# Patient Record
Sex: Male | Born: 1995 | Race: Black or African American | Hispanic: No | Marital: Single | Smoking: Current some day smoker
Health system: Southern US, Community
[De-identification: ages and names within clinical notes are randomized; demographics above are authoritative.]

## PROBLEM LIST (undated history)

## (undated) DIAGNOSIS — R519 Headache, unspecified: Secondary | ICD-10-CM

## (undated) DIAGNOSIS — R51 Headache: Secondary | ICD-10-CM

## (undated) DIAGNOSIS — F29 Unspecified psychosis not due to a substance or known physiological condition: Secondary | ICD-10-CM

## (undated) DIAGNOSIS — F909 Attention-deficit hyperactivity disorder, unspecified type: Secondary | ICD-10-CM

## (undated) DIAGNOSIS — F209 Schizophrenia, unspecified: Secondary | ICD-10-CM

## (undated) DIAGNOSIS — F4310 Post-traumatic stress disorder, unspecified: Secondary | ICD-10-CM

## (undated) HISTORY — PX: FRACTURE SURGERY: SHX138

## (undated) HISTORY — DX: Headache: R51

## (undated) HISTORY — DX: Headache, unspecified: R51.9

---
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
## 2017-12-17 ENCOUNTER — Emergency Department (HOSPITAL_COMMUNITY)
Admission: EM | Admit: 2017-12-17 | Discharge: 2017-12-17 | Disposition: A | Discharge status: Home / Self Care | Payer: Self-pay | Attending: Emergency Medicine | Admitting: Emergency Medicine

## 2017-12-17 ENCOUNTER — Emergency Department (HOSPITAL_COMMUNITY): Payer: Self-pay

## 2017-12-17 ENCOUNTER — Encounter (HOSPITAL_COMMUNITY): Payer: Self-pay

## 2017-12-17 ENCOUNTER — Other Ambulatory Visit: Payer: Self-pay

## 2017-12-17 DIAGNOSIS — G43909 Migraine, unspecified, not intractable, without status migrainosus: Secondary | ICD-10-CM | POA: Insufficient documentation

## 2017-12-17 DIAGNOSIS — Z79899 Other long term (current) drug therapy: Secondary | ICD-10-CM | POA: Insufficient documentation

## 2017-12-17 LAB — CBC
HEMATOCRIT: 42.8 % (ref 39.0–52.0)
HEMOGLOBIN: 14.4 g/dL (ref 13.0–17.0)
MCH: 28.3 pg (ref 26.0–34.0)
MCHC: 33.6 g/dL (ref 30.0–36.0)
MCV: 84.3 fL (ref 78.0–100.0)
Platelets: 302 10*3/uL (ref 150–400)
RBC: 5.08 MIL/uL (ref 4.22–5.81)
RDW: 12.4 % (ref 11.5–15.5)
WBC: 7.7 10*3/uL (ref 4.0–10.5)

## 2017-12-17 LAB — BASIC METABOLIC PANEL
Anion gap: 8 (ref 5–15)
BUN: 9 mg/dL (ref 6–20)
CHLORIDE: 108 mmol/L (ref 98–111)
CO2: 27 mmol/L (ref 22–32)
CREATININE: 1.17 mg/dL (ref 0.61–1.24)
Calcium: 10 mg/dL (ref 8.9–10.3)
GFR calc Af Amer: >60 mL/min (ref >60)
GFR calc non Af Amer: >60 mL/min (ref >60)
Glucose, Bld: 88 mg/dL (ref 70–99)
Potassium: 4 mmol/L (ref 3.5–5.1)
Sodium: 143 mmol/L (ref 135–145)

## 2017-12-17 MED ORDER — METOCLOPRAMIDE HCL 10 MG PO TABS
5.0000 mg | ORAL_TABLET | Freq: Once | ORAL | Status: AC
  Administered 2017-12-17: 5 mg via ORAL
  Filled 2017-12-17: qty 1

## 2017-12-17 MED ORDER — DIPHENHYDRAMINE HCL 25 MG PO CAPS
25.0000 mg | ORAL_CAPSULE | Freq: Once | ORAL | Status: AC
  Administered 2017-12-17: 25 mg via ORAL
  Filled 2017-12-17: qty 1

## 2017-12-17 MED ORDER — KETOROLAC TROMETHAMINE 30 MG/ML IJ SOLN
30.0000 mg | Freq: Once | INTRAMUSCULAR | Status: AC
  Administered 2017-12-17: 30 mg via INTRAMUSCULAR
  Filled 2017-12-17: qty 1

## 2017-12-17 NOTE — ED Triage Notes (Signed)
Patient from home with migraines.  Took Excedrin PTA.  A&Ox4 no visual or neuro changes.  BP 137/71

## 2017-12-17 NOTE — Discharge Instructions (Addendum)
Please read attached information. If you experience any new or worsening signs or symptoms please return to the emergency room for evaluation. Please follow-up with your primary care provider or specialist as discussed. Please use medication prescribed only as directed and discontinue taking if you have any concerning signs or symptoms.   °
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
## 2017-12-17 NOTE — ED Provider Notes (Signed)
Patient placed in Quick Look pathway, seen and evaluated   Chief Complaint: Headache  HPI:   Patient presents for evaluation of headache, which is like his typical migraines, this started today and has been constant in nature, he took Excedrin prior to arrival without relief.  He denies vision changes, no nausea or vomiting, no weakness numbness or tingling.  ROS: + Headache. -Vision changes, weakness, numbness, dizziness, nausea or vomiting  Physical Exam:   Gen: No distress  Neuro: Awake and Alert  Skin: Warm    Focused Exam: Speech is clear, able to follow commands CN III-XII intact Normal strength in upper and lower extremities bilaterally including dorsiflexion and plantar flexion, strong and equal grip strength Sensation normal to light and sharp touch Moves extremities without ataxia, coordination intact No pronator drift    Initiation of care has begun. The patient has been counseled on the process, plan, and necessity for staying for the completion/evaluation, and the remainder of the medical screening examination   Legrand RamsFord, Aftyn Nott N, PA-C 12/17/17 1646    Azalia Bilisampos, Kevin, MD 12/17/17 [phone]

## 2017-12-17 NOTE — ED Notes (Signed)
Patient transported to CT 

## 2017-12-17 NOTE — ED Notes (Signed)
ED Provider at bedside. 

## 2017-12-17 NOTE — ED Notes (Signed)
Pt verbalizes understanding of d/c instructions. Pt ambulatory at d/c with all belongings.   

## 2017-12-18 ENCOUNTER — Encounter (INDEPENDENT_AMBULATORY_CARE_PROVIDER_SITE_OTHER): Payer: Self-pay

## 2017-12-26 ENCOUNTER — Encounter

## 2017-12-27 ENCOUNTER — Encounter (HOSPITAL_COMMUNITY): Payer: Self-pay

## 2017-12-27 ENCOUNTER — Emergency Department (HOSPITAL_COMMUNITY)
Admission: EM | Admit: 2017-12-27 | Discharge: 2017-12-27 | Disposition: A | Discharge status: Home / Self Care | Payer: Self-pay | Attending: Emergency Medicine | Admitting: Emergency Medicine

## 2017-12-27 ENCOUNTER — Emergency Department (HOSPITAL_COMMUNITY): Payer: Self-pay

## 2017-12-27 ENCOUNTER — Other Ambulatory Visit: Payer: Self-pay

## 2017-12-27 DIAGNOSIS — Y999 Unspecified external cause status: Secondary | ICD-10-CM | POA: Insufficient documentation

## 2017-12-27 DIAGNOSIS — S0230XA Fracture of orbital floor, unspecified side, initial encounter for closed fracture: Secondary | ICD-10-CM | POA: Insufficient documentation

## 2017-12-27 DIAGNOSIS — Z23 Encounter for immunization: Secondary | ICD-10-CM | POA: Insufficient documentation

## 2017-12-27 DIAGNOSIS — Y929 Unspecified place or not applicable: Secondary | ICD-10-CM | POA: Insufficient documentation

## 2017-12-27 DIAGNOSIS — Y939 Activity, unspecified: Secondary | ICD-10-CM | POA: Insufficient documentation

## 2017-12-27 DIAGNOSIS — S0081XA Abrasion of other part of head, initial encounter: Secondary | ICD-10-CM | POA: Insufficient documentation

## 2017-12-27 MED ORDER — HYDROCODONE-ACETAMINOPHEN 5-325 MG PO TABS: 2.0000 | ORAL_TABLET | ORAL | 0 refills | Status: DC | PRN

## 2017-12-27 MED ORDER — FLUORESCEIN SODIUM 1 MG OP STRP
ORAL_STRIP | OPHTHALMIC | Status: AC
  Administered 2017-12-27: 1
  Filled 2017-12-27: qty 1

## 2017-12-27 MED ORDER — OXYCODONE HCL 5 MG PO TABS
5.0000 mg | ORAL_TABLET | Freq: Once | ORAL | Status: AC
  Administered 2017-12-27: 5 mg via ORAL
  Filled 2017-12-27: qty 1

## 2017-12-27 MED ORDER — ERYTHROMYCIN 5 MG/GM OP OINT
TOPICAL_OINTMENT | Freq: Once | OPHTHALMIC | Status: AC
  Administered 2017-12-27: 19:00:00 via OPHTHALMIC
  Filled 2017-12-27: qty 3.5

## 2017-12-27 MED ORDER — TETRACAINE HCL 0.5 % OP SOLN
1.0000 [drp] | Freq: Once | OPHTHALMIC | Status: AC
Start: 2017-12-27 — End: 2017-12-27
  Administered 2017-12-27: 1 [drp] via OPHTHALMIC
  Filled 2017-12-27: qty 4

## 2017-12-27 MED ORDER — ACETAMINOPHEN 500 MG PO TABS
1000.0000 mg | ORAL_TABLET | Freq: Once | ORAL | Status: AC
  Administered 2017-12-27: 1000 mg via ORAL
  Filled 2017-12-27: qty 2

## 2017-12-27 MED ORDER — NAPROXEN 500 MG PO TABS: 500.0000 mg | ORAL_TABLET | Freq: Two times a day (BID) | ORAL | 0 refills | Status: DC

## 2017-12-27 MED ORDER — TETANUS-DIPHTH-ACELL PERTUSSIS 5-2.5-18.5 LF-MCG/0.5 IM SUSP
0.5000 mL | Freq: Once | INTRAMUSCULAR | Status: AC
Start: 2017-12-27 — End: 2017-12-27
  Administered 2017-12-27: 0.5 mL via INTRAMUSCULAR
  Filled 2017-12-27: qty 0.5

## 2017-12-27 NOTE — Discharge Instructions (Addendum)
Use the eye ointment 3 times a day. Follow up with Dr. Jenne PaneBates in one week. Call the office tomorrow to schedule the follow up appointment. Return here as needed.
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
## 2017-12-31 ENCOUNTER — Encounter: Payer: Self-pay | Admitting: Neurology

## 2017-12-31 ENCOUNTER — Ambulatory Visit: Payer: Self-pay | Admitting: Neurology

## 2017-12-31 VITALS — BP 122/68 | HR 72 | Ht 70.0 in | Wt 215.0 lb

## 2017-12-31 DIAGNOSIS — H9313 Tinnitus, bilateral: Secondary | ICD-10-CM | POA: Insufficient documentation

## 2017-12-31 DIAGNOSIS — G43709 Chronic migraine without aura, not intractable, without status migrainosus: Secondary | ICD-10-CM

## 2017-12-31 DIAGNOSIS — IMO0002 Reserved for concepts with insufficient information to code with codable children: Secondary | ICD-10-CM | POA: Insufficient documentation

## 2017-12-31 MED ORDER — SUMATRIPTAN SUCCINATE 50 MG PO TABS: 50.0000 mg | ORAL_TABLET | ORAL | 6 refills | Status: AC | PRN
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
## 2018-01-05 ENCOUNTER — Emergency Department (HOSPITAL_COMMUNITY)
Admission: EM | Admit: 2018-01-05 | Discharge: 2018-01-05 | Disposition: A | Discharge status: Home / Self Care | Payer: Self-pay | Attending: Emergency Medicine | Admitting: Emergency Medicine

## 2018-01-05 ENCOUNTER — Encounter (HOSPITAL_COMMUNITY): Payer: Self-pay | Admitting: *Deleted

## 2018-01-05 ENCOUNTER — Other Ambulatory Visit: Payer: Self-pay

## 2018-01-05 DIAGNOSIS — Z5900 Homelessness unspecified: Secondary | ICD-10-CM

## 2018-01-05 DIAGNOSIS — Z79899 Other long term (current) drug therapy: Secondary | ICD-10-CM | POA: Insufficient documentation

## 2018-01-05 DIAGNOSIS — F209 Schizophrenia, unspecified: Secondary | ICD-10-CM | POA: Insufficient documentation

## 2018-01-05 DIAGNOSIS — F17200 Nicotine dependence, unspecified, uncomplicated: Secondary | ICD-10-CM | POA: Insufficient documentation

## 2018-01-05 DIAGNOSIS — H9313 Tinnitus, bilateral: Secondary | ICD-10-CM | POA: Insufficient documentation

## 2018-01-05 DIAGNOSIS — F4310 Post-traumatic stress disorder, unspecified: Secondary | ICD-10-CM | POA: Insufficient documentation

## 2018-01-05 DIAGNOSIS — F909 Attention-deficit hyperactivity disorder, unspecified type: Secondary | ICD-10-CM | POA: Insufficient documentation

## 2018-01-05 DIAGNOSIS — F1220 Cannabis dependence, uncomplicated: Secondary | ICD-10-CM

## 2018-01-05 HISTORY — DX: Schizophrenia, unspecified: F20.9

## 2018-01-05 HISTORY — DX: Unspecified psychosis not due to a substance or known physiological condition: F29

## 2018-01-05 HISTORY — DX: Post-traumatic stress disorder, unspecified: F43.10

## 2018-01-05 LAB — RAPID URINE DRUG SCREEN, HOSP PERFORMED
Amphetamines: NOT DETECTED
BARBITURATES: NOT DETECTED
Benzodiazepines: NOT DETECTED
COCAINE: NOT DETECTED
TETRAHYDROCANNABINOL: POSITIVE — AB

## 2018-01-05 LAB — CBC WITH DIFFERENTIAL/PLATELET
BASOS PCT: 0 %
Basophils Absolute: 0 10*3/uL (ref 0.0–0.1)
EOS ABS: 0.2 10*3/uL (ref 0.0–0.7)
Eosinophils Relative: 3 %
HEMATOCRIT: 40 % (ref 39.0–52.0)
HEMOGLOBIN: 14.2 g/dL (ref 13.0–17.0)
LYMPHS ABS: 2.1 10*3/uL (ref 0.7–4.0)
Lymphocytes Relative: 27 %
MCH: 28.9 pg (ref 26.0–34.0)
MCHC: 35.5 g/dL (ref 30.0–36.0)
MCV: 81.5 fL (ref 78.0–100.0)
MONO ABS: 0.5 10*3/uL (ref 0.1–1.0)
Monocytes Relative: 6 %
NEUTROS ABS: 5 10*3/uL (ref 1.7–7.7)
Neutrophils Relative %: 64 %
Platelets: 306 10*3/uL (ref 150–400)
RBC: 4.91 MIL/uL (ref 4.22–5.81)
RDW: 12.3 % (ref 11.5–15.5)
WBC: 7.9 10*3/uL (ref 4.0–10.5)

## 2018-01-05 LAB — BASIC METABOLIC PANEL
Anion gap: 11 (ref 5–15)
BUN: 13 mg/dL (ref 6–20)
CALCIUM: 9.4 mg/dL (ref 8.9–10.3)
CHLORIDE: 104 mmol/L (ref 98–111)
CO2: 25 mmol/L (ref 22–32)
CREATININE: 0.87 mg/dL (ref 0.61–1.24)
GFR calc non Af Amer: >60 mL/min (ref >60)
Glucose, Bld: 93 mg/dL (ref 70–99)
Potassium: 3.6 mmol/L (ref 3.5–5.1)
Sodium: 140 mmol/L (ref 135–145)

## 2018-01-05 LAB — ETHANOL

## 2018-01-05 LAB — ACETAMINOPHEN LEVEL

## 2018-01-05 LAB — SALICYLATE LEVEL
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
## 2018-01-05 NOTE — Progress Notes (Signed)
Per Donell SievertSpencer Simon, PA pt is recommended for continued observation for safety and stabilization and to be reassessed in the AM by psych. EDP Roxy HorsemanBrowning, Robert, PA-C and pt's nurse Kallie LocksAjsa, RN have been advised of the disposition.  Princess BruinsAquicha Noha Karasik, MSW, LCSW Therapeutic Triage Specialist  [phone]

## 2018-01-05 NOTE — ED Notes (Signed)
Pt stated "Like, literally I'm trying to find a place to stay."

## 2018-01-05 NOTE — ED Notes (Signed)
Pt to room #35.Pt uninterested. Pt denies SI/HI/AVH. Encouragement and support provided. Special checks q 15 mins in place for safety, Video monitoring in place. Will continue to monitor.

## 2018-01-05 NOTE — ED Notes (Addendum)
Pt has one white pt belongings' bag and one gray/black nike bookbag placed in the cabinet labeled "[facility] belongings [address] [facility]
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
## 2018-01-05 NOTE — Discharge Instructions (Signed)
Follow up with:   [facility] [address], [place], [place] [address]  [facility]. Call to schedule an appointment or walk in to utilize services.  [address] [place], [place] [address] [phone]

## 2018-01-05 NOTE — ED Notes (Signed)
[facility] Expected date:  Expected time:  Means of arrival:  Comments: 24
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
## 2018-01-05 NOTE — ED Triage Notes (Signed)
Per [facility], pt stated he smoked some weed then began having random pains to right hand and back.  Also c/o h/a and took an Imitrex earlier in the day, then broke one in half & took approx 45 minutes and h/a is now gone.

## 2018-01-05 NOTE — ED Notes (Addendum)
Urine culture sent down with UDS. 
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
## 2019-06-12 ENCOUNTER — Emergency Department (HOSPITAL_COMMUNITY)
Admission: EM | Admit: 2019-06-12 | Discharge: 2019-06-12 | Disposition: A | Discharge status: Home / Self Care | Payer: Self-pay | Attending: Emergency Medicine | Admitting: Emergency Medicine

## 2019-06-12 ENCOUNTER — Encounter (HOSPITAL_COMMUNITY): Payer: Self-pay | Admitting: Emergency Medicine

## 2019-06-12 DIAGNOSIS — F918 Other conduct disorders: Secondary | ICD-10-CM | POA: Insufficient documentation

## 2019-06-12 DIAGNOSIS — R456 Violent behavior: Secondary | ICD-10-CM | POA: Insufficient documentation

## 2019-06-12 DIAGNOSIS — F17200 Nicotine dependence, unspecified, uncomplicated: Secondary | ICD-10-CM | POA: Insufficient documentation

## 2019-06-12 DIAGNOSIS — R4689 Other symptoms and signs involving appearance and behavior: Secondary | ICD-10-CM

## 2019-06-12 DIAGNOSIS — Z79899 Other long term (current) drug therapy: Secondary | ICD-10-CM | POA: Insufficient documentation

## 2019-06-12 DIAGNOSIS — Z046 Encounter for general psychiatric examination, requested by authority: Secondary | ICD-10-CM | POA: Insufficient documentation

## 2019-06-12 LAB — COMPREHENSIVE METABOLIC PANEL
ALT: 15 U/L (ref 0–44)
AST: 17 U/L (ref 15–41)
Albumin: 4.6 g/dL (ref 3.5–5.0)
Alkaline Phosphatase: 70 U/L (ref 38–126)
Anion gap: 9 (ref 5–15)
BUN: 5 mg/dL — ABNORMAL LOW (ref 6–20)
CO2: 27 mmol/L (ref 22–32)
Calcium: 9.9 mg/dL (ref 8.9–10.3)
Chloride: 104 mmol/L (ref 98–111)
Creatinine, Ser: 1.14 mg/dL (ref 0.61–1.24)
GFR calc Af Amer: >60 mL/min (ref >60)
GFR calc non Af Amer: >60 mL/min (ref >60)
Glucose, Bld: 88 mg/dL (ref 70–99)
Potassium: 4.7 mmol/L (ref 3.5–5.1)
Sodium: 140 mmol/L (ref 135–145)
Total Bilirubin: 0.8 mg/dL (ref 0.3–1.2)
Total Protein: 6.9 g/dL (ref 6.5–8.1)

## 2019-06-12 LAB — ETHANOL: Alcohol, Ethyl (B): <10 mg/dL (ref <10)

## 2019-06-12 LAB — CBC
HCT: 45.9 % (ref 39.0–52.0)
Hemoglobin: 15.4 g/dL (ref 13.0–17.0)
MCH: 28.2 pg (ref 26.0–34.0)
MCHC: 33.6 g/dL (ref 30.0–36.0)
MCV: 83.9 fL (ref 80.0–100.0)
Platelets: 325 10*3/uL (ref 150–400)
RBC: 5.47 MIL/uL (ref 4.22–5.81)
RDW: 11.8 % (ref 11.5–15.5)
WBC: 7.9 10*3/uL (ref 4.0–10.5)
nRBC: 0 % (ref 0.0–0.2)

## 2019-06-12 LAB — ACETAMINOPHEN LEVEL: Acetaminophen (Tylenol), Serum: <10 ug/mL — ABNORMAL LOW (ref 10–30)

## 2019-06-12 LAB — SALICYLATE LEVEL: Salicylate Lvl: <7 mg/dL — ABNORMAL LOW (ref 7.0–30.0)

## 2019-06-12 NOTE — Discharge Instructions (Signed)
Please follow up with any out patient providers. See information below:  Outpatient Resources (Sliding scale- no insurance)  [facility]  (Assessment, Medication management and counseling) [address] [place], [place] [address] Phone: [phone] *Some walk-in assessment times available- call number above   [facility]  (Medication management and counseling) Address: [address], [place], [place] [address] Phone: [phone] *Accepts walk-in's M-F starting at 8am  [facility]  (Counseling specializing in trauma and co-occurring disorders) [email or web address] [address] [place], [place], [address] [phone] Email: [email or web address] [email or web address]  [facility]  (Wellness classes, peer support) [address],  [place], [place] [address] Phone: [phone]  [facility]  (individual and group counseling)  [address] [place], [place] [address] [phone]  Suicide Prevention information listed below:  [facility] Behavioral Health Crisis Line: [phone]  Mobile Crisis Teams [facility]      Mobile Crisis Care Unit     [phone]  If in crisis please go to your nearest emergency department to be evaluated or call 911  ____________________________________________________   Outpatient Psychiatry and/or Counseling   [facility] * (Intensive outpatient, partial hospitalization, individual therapy, and medication management) [address] [place], [place] [address] [phone] [facility] * (medication management and therapy) [address] [place], [place] [address] [phone] [facility] (Therapy only)* (takes [place] and most major insurances) [place]:  [address] [facility] [place], [place]  Phone: [phone] [place]:  [address] [place], [place]  [address]   Phone: [phone] [place]:  [address] [place], [place] [address]  Phone: [phone] [facility]  (specializes in trauma therapy, substance abuse and mood disorders. Takes patients without insurance for little to no cost out of pocket. Also takes most major insurances) [address]  [place], [place] [address] [phone] [facility] * (therapy and medication management) [address] [place], [place] [address] [phone] [facility] (Medication Management) [address] [place], [place] [address] [phone] [facility] * (therapy only) [address] [place], [place] [address] [phone] [facility] [address] [place] [address] [phone] [facility] * [address] [place], [place] [address] [phone] [facility]* (accepts non-insured)  [address] [place], [place] [address] [phone] Walk in Monday-Friday 8am-3pm [facility] [place]* (takes sliding scale and Medicaid as well as other major insurances)  [place]:   [address], [place]  Phone: [phone] [place]:   [facility] [address] [place], [place]  Phone: [phone] Walk in Monday-Friday 830am-230pm [facility]* (accepts [employer] non-insured) [address] [place] [address] [place], [place] [address] [phone] Walk in Monday-Friday 8am-5pm For a mental health emergency call: Mobile Crisis- [facility] [phone]
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
## 2019-06-12 NOTE — Progress Notes (Signed)
CSW faxed over outpatient resources and also added them to the patient's after visit summary.   Drucilla Schmidt, MSW, LCSW-A Clinical Disposition Social Worker [facility]/TTS [phone]

## 2019-06-12 NOTE — BHH Counselor (Signed)
  BH ASSESSMENT DISPOSITION  [facility] discussed case with BH Provider, Berneice Heinrich, NP who recommends discharge with community resources.  CSW faxed outpatient community resources to pt's nurse for discharge.  Jaydis Duchene L. Muhammed Teutsch, MS, [facility], [facility] Therapeutic Triage Specialist  [phone]
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
## 2019-11-04 ENCOUNTER — Emergency Department (HOSPITAL_COMMUNITY): Payer: Self-pay

## 2019-11-04 ENCOUNTER — Other Ambulatory Visit: Payer: Self-pay

## 2019-11-04 ENCOUNTER — Encounter (HOSPITAL_COMMUNITY): Payer: Self-pay

## 2019-11-04 DIAGNOSIS — Y999 Unspecified external cause status: Secondary | ICD-10-CM | POA: Insufficient documentation

## 2019-11-04 DIAGNOSIS — Y9367 Activity, basketball: Secondary | ICD-10-CM | POA: Insufficient documentation

## 2019-11-04 DIAGNOSIS — S99911A Unspecified injury of right ankle, initial encounter: Secondary | ICD-10-CM | POA: Insufficient documentation

## 2019-11-04 DIAGNOSIS — Y929 Unspecified place or not applicable: Secondary | ICD-10-CM | POA: Insufficient documentation

## 2019-11-04 DIAGNOSIS — X501XXA Overexertion from prolonged static or awkward postures, initial encounter: Secondary | ICD-10-CM | POA: Insufficient documentation

## 2019-11-04 DIAGNOSIS — F17200 Nicotine dependence, unspecified, uncomplicated: Secondary | ICD-10-CM | POA: Insufficient documentation

## 2019-11-04 NOTE — ED Triage Notes (Signed)
Pt was playing basketball and rolled his right ankle

## 2019-11-05 ENCOUNTER — Emergency Department (HOSPITAL_COMMUNITY)
Admission: EM | Admit: 2019-11-05 | Discharge: 2019-11-05 | Disposition: A | Discharge status: Home / Self Care | Payer: Self-pay | Attending: Emergency Medicine | Admitting: Emergency Medicine

## 2019-11-05 DIAGNOSIS — S99911A Unspecified injury of right ankle, initial encounter: Secondary | ICD-10-CM

## 2019-11-05 NOTE — Discharge Instructions (Signed)
Can take tylenol or motrin for pain.  Wear ankle brace for now.  Can ice/elevate if worsening swelling. Follow-up with Dr. Roda Shutters if any ongoing issues with the ankle. Return to the ED for new or worsening symptoms.
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
## 2022-01-04 IMAGING — CR DG ANKLE COMPLETE 3+V*R*
3 series · 3 of 3 positions shown · non-contrast
Comparison: None.

CLINICAL DATA: Basketball injury, fell, right ankle injury

EXAM:
RIGHT ANKLE - COMPLETE 3+ VIEW

[x ankle ap right]
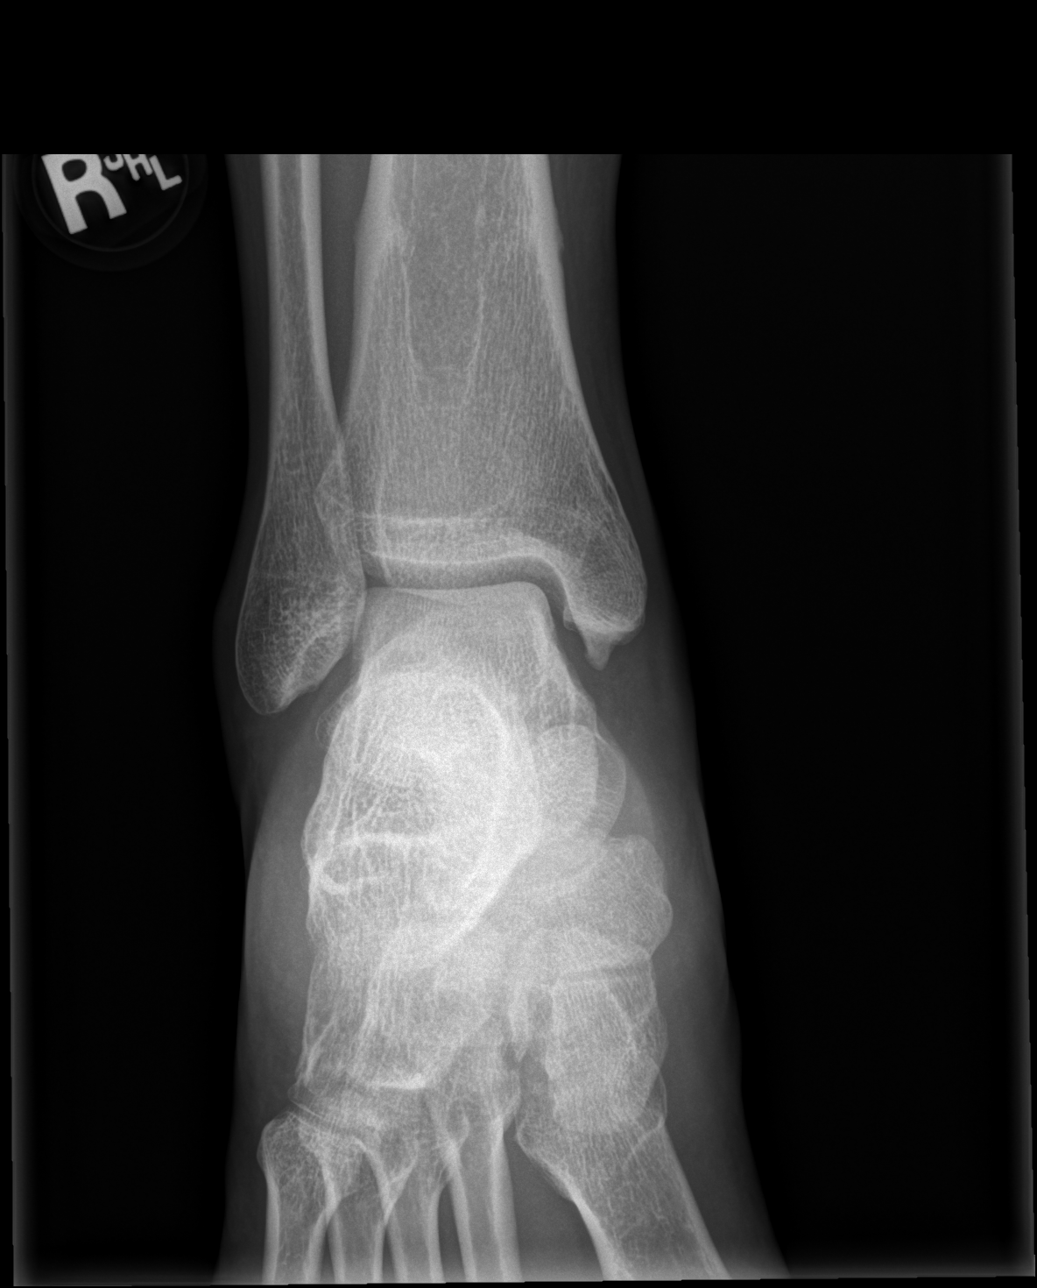

[x ankle obl right]
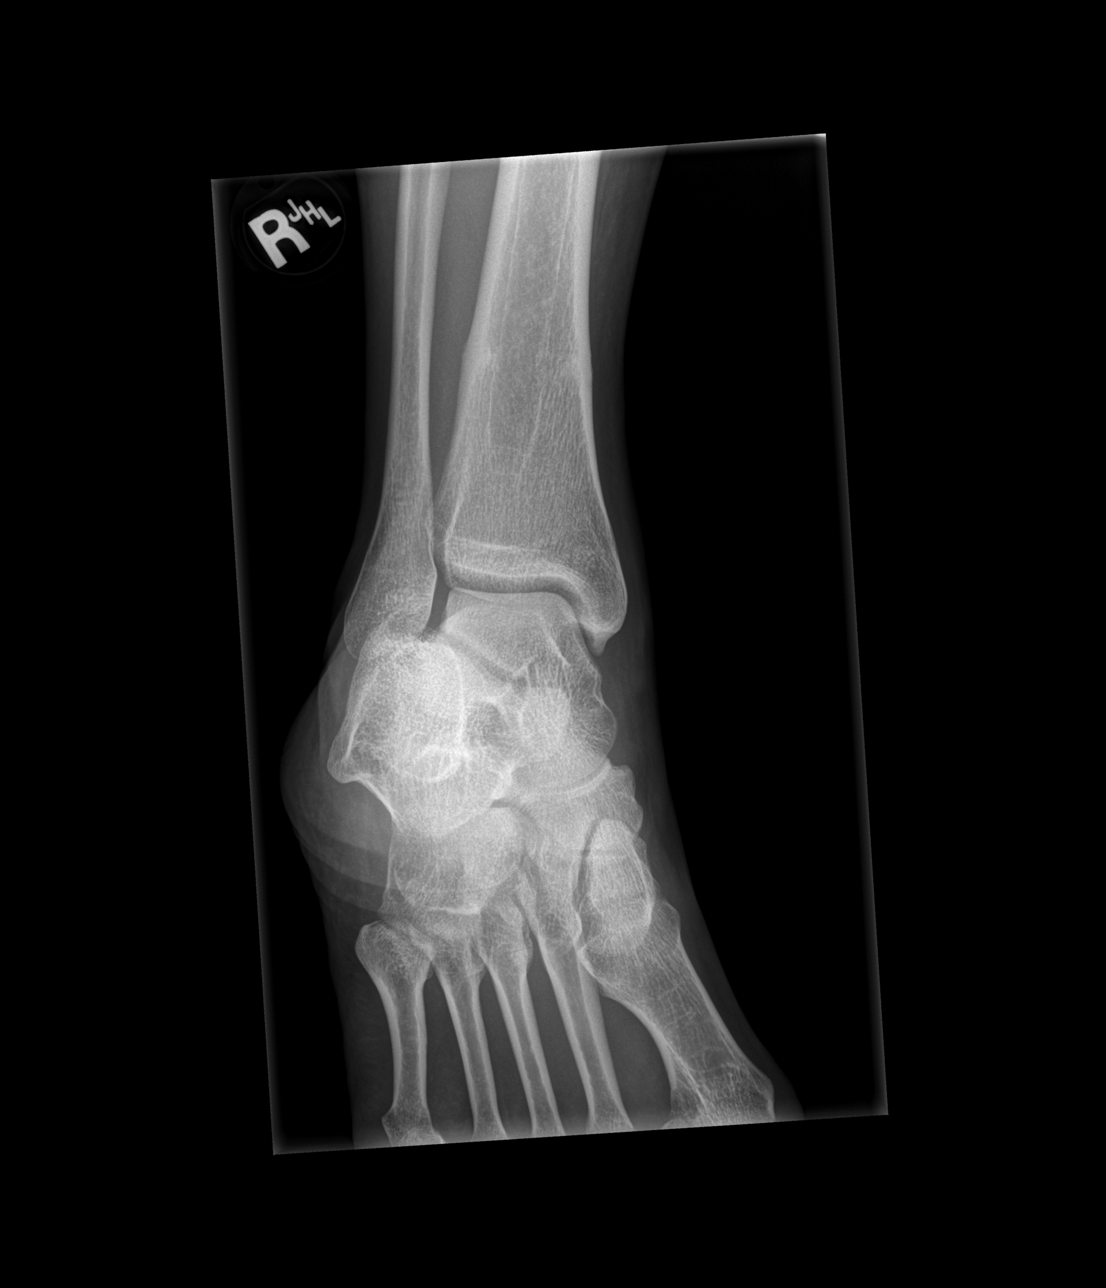

[x ankle lat right]
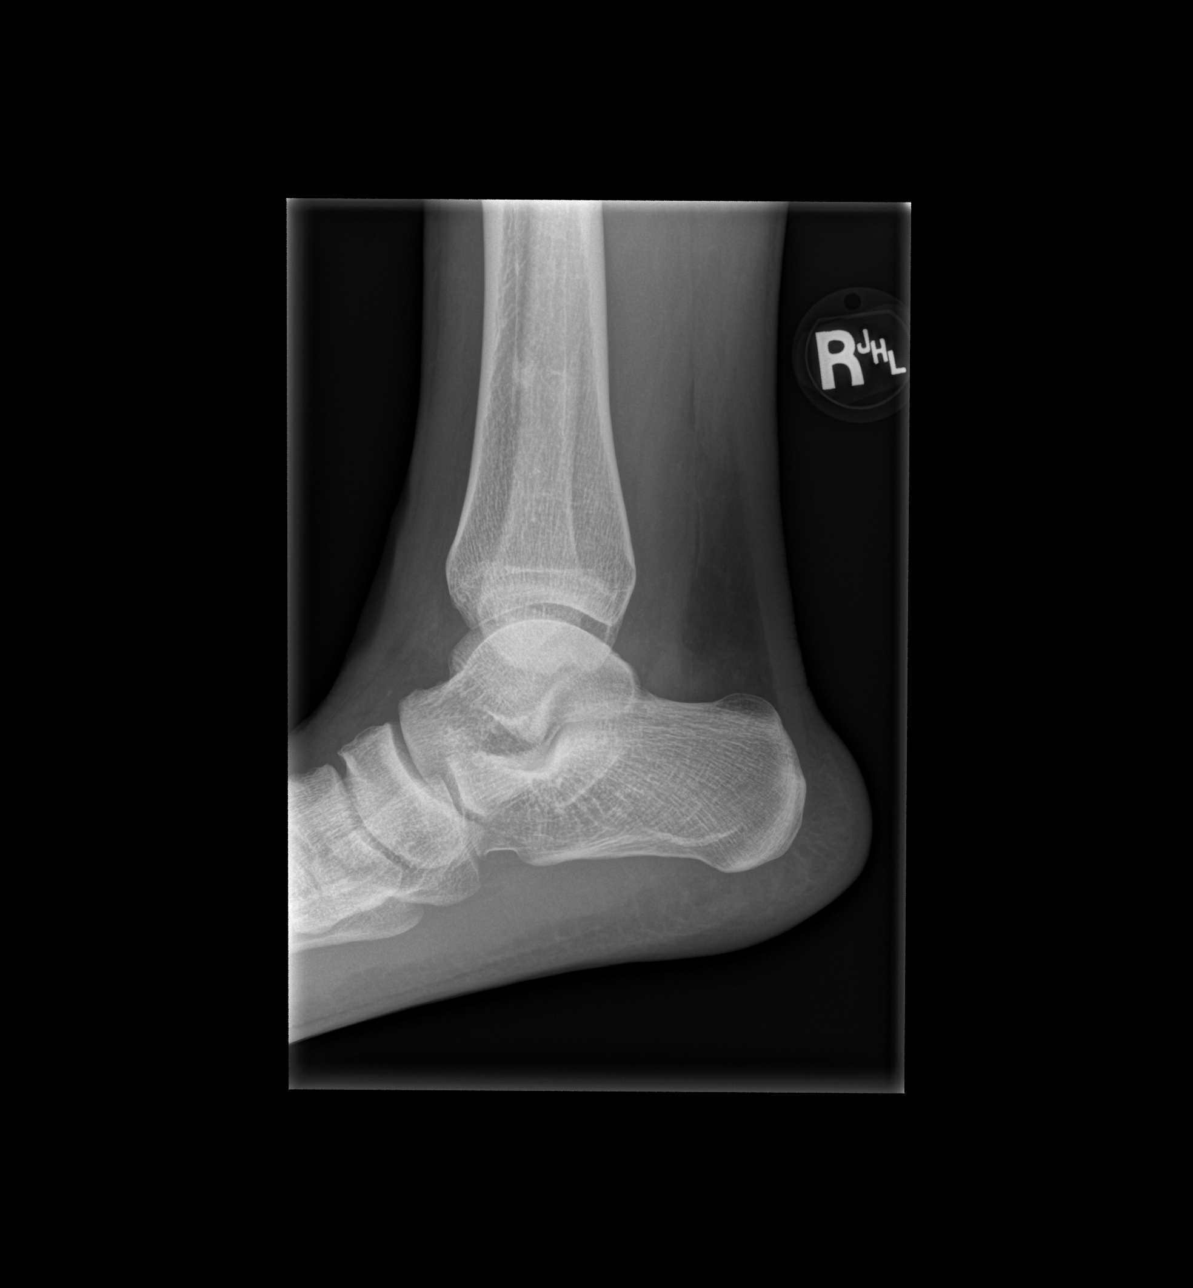

[3 of 3 positions shown; findings below may reference images not displayed]

FINDINGS: Frontal, oblique, and lateral views of the right ankle are obtained.
No fracture, subluxation, or dislocation. Joint spaces are well
preserved. Soft tissues are normal.
IMPRESSION: 1. Unremarkable right ankle.

## 2022-11-25 ENCOUNTER — Emergency Department (HOSPITAL_COMMUNITY)
Admission: EM | Admit: 2022-11-25 | Discharge: 2022-11-25 | Disposition: A | Discharge status: Home / Self Care | Payer: No Typology Code available for payment source | Attending: Emergency Medicine | Admitting: Emergency Medicine

## 2022-11-25 ENCOUNTER — Other Ambulatory Visit: Payer: Self-pay

## 2022-11-25 DIAGNOSIS — Z23 Encounter for immunization: Secondary | ICD-10-CM | POA: Diagnosis not present

## 2022-11-25 DIAGNOSIS — S80212A Abrasion, left knee, initial encounter: Secondary | ICD-10-CM | POA: Insufficient documentation

## 2022-11-25 DIAGNOSIS — S50312A Abrasion of left elbow, initial encounter: Secondary | ICD-10-CM | POA: Insufficient documentation

## 2022-11-25 DIAGNOSIS — T07XXXA Unspecified multiple injuries, initial encounter: Secondary | ICD-10-CM

## 2022-11-25 DIAGNOSIS — Y92410 Unspecified street and highway as the place of occurrence of the external cause: Secondary | ICD-10-CM | POA: Insufficient documentation

## 2022-11-25 MED ORDER — OXYCODONE HCL 5 MG PO TABS
5.0000 mg | ORAL_TABLET | Freq: Once | ORAL | Status: AC
  Administered 2022-11-25: 5 mg via ORAL
  Filled 2022-11-25: qty 1

## 2022-11-25 MED ORDER — ACETAMINOPHEN 500 MG PO TABS
1000.0000 mg | ORAL_TABLET | Freq: Once | ORAL | Status: AC
  Administered 2022-11-25: 1000 mg via ORAL
  Filled 2022-11-25: qty 2

## 2022-11-25 MED ORDER — BACITRACIN ZINC 500 UNIT/GM EX OINT
TOPICAL_OINTMENT | Freq: Two times a day (BID) | CUTANEOUS | Status: DC
  Administered 2022-11-25: 1 via TOPICAL
  Filled 2022-11-25: qty 0.9

## 2022-11-25 MED ORDER — KETOROLAC TROMETHAMINE 15 MG/ML IJ SOLN
15.0000 mg | Freq: Once | INTRAMUSCULAR | Status: AC
  Administered 2022-11-25: 15 mg via INTRAMUSCULAR
  Filled 2022-11-25: qty 1

## 2022-11-25 MED ORDER — TETANUS-DIPHTH-ACELL PERTUSSIS 5-2.5-18.5 LF-MCG/0.5 IM SUSY
0.5000 mL | PREFILLED_SYRINGE | Freq: Once | INTRAMUSCULAR | Status: AC
  Administered 2022-11-25: 0.5 mL via INTRAMUSCULAR
  Filled 2022-11-25: qty 0.5

## 2022-11-25 NOTE — Discharge Instructions (Signed)
Apply an ointment to the area twice a day. Keep it covered.   Please return for rapid spreading redness or if you develop a fever.
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
## 2023-02-16 ENCOUNTER — Inpatient Hospital Stay (HOSPITAL_COMMUNITY)
Admission: EM | Admit: 2023-02-16 | Discharge: 2023-02-19 | DRG: 816 | Disposition: A | Discharge status: Home / Self Care | Payer: No Typology Code available for payment source | Attending: Surgery | Admitting: Surgery

## 2023-02-16 ENCOUNTER — Other Ambulatory Visit: Payer: Self-pay

## 2023-02-16 ENCOUNTER — Emergency Department (HOSPITAL_COMMUNITY): Payer: No Typology Code available for payment source

## 2023-02-16 ENCOUNTER — Emergency Department (HOSPITAL_COMMUNITY): Payer: Self-pay

## 2023-02-16 ENCOUNTER — Encounter (HOSPITAL_COMMUNITY): Payer: Self-pay

## 2023-02-16 DIAGNOSIS — S90812A Abrasion, left foot, initial encounter: Secondary | ICD-10-CM | POA: Diagnosis present

## 2023-02-16 DIAGNOSIS — F209 Schizophrenia, unspecified: Secondary | ICD-10-CM | POA: Diagnosis present

## 2023-02-16 DIAGNOSIS — T07XXXA Unspecified multiple injuries, initial encounter: Secondary | ICD-10-CM

## 2023-02-16 DIAGNOSIS — Z87891 Personal history of nicotine dependence: Secondary | ICD-10-CM | POA: Diagnosis not present

## 2023-02-16 DIAGNOSIS — F4310 Post-traumatic stress disorder, unspecified: Secondary | ICD-10-CM | POA: Diagnosis present

## 2023-02-16 DIAGNOSIS — S40812A Abrasion of left upper arm, initial encounter: Secondary | ICD-10-CM | POA: Diagnosis present

## 2023-02-16 DIAGNOSIS — Z91013 Allergy to seafood: Secondary | ICD-10-CM | POA: Diagnosis not present

## 2023-02-16 DIAGNOSIS — F909 Attention-deficit hyperactivity disorder, unspecified type: Secondary | ICD-10-CM | POA: Diagnosis present

## 2023-02-16 DIAGNOSIS — S36031A Moderate laceration of spleen, initial encounter: Principal | ICD-10-CM | POA: Diagnosis present

## 2023-02-16 DIAGNOSIS — Y92410 Unspecified street and highway as the place of occurrence of the external cause: Secondary | ICD-10-CM

## 2023-02-16 DIAGNOSIS — S80212A Abrasion, left knee, initial encounter: Secondary | ICD-10-CM | POA: Diagnosis present

## 2023-02-16 DIAGNOSIS — S36039A Unspecified laceration of spleen, initial encounter: Secondary | ICD-10-CM | POA: Diagnosis present

## 2023-02-16 LAB — COMPREHENSIVE METABOLIC PANEL
ALT: 38 U/L (ref 0–44)
AST: 30 U/L (ref 15–41)
Albumin: 3.8 g/dL (ref 3.5–5.0)
Alkaline Phosphatase: 80 U/L (ref 38–126)
Anion gap: 10 (ref 5–15)
BUN: 16 mg/dL (ref 6–20)
CO2: 21 mmol/L — ABNORMAL LOW (ref 22–32)
Calcium: 8.7 mg/dL — ABNORMAL LOW (ref 8.9–10.3)
Chloride: 106 mmol/L (ref 98–111)
Creatinine, Ser: 1.25 mg/dL — ABNORMAL HIGH (ref 0.61–1.24)
GFR, Estimated: >60 mL/min (ref >60)
Glucose, Bld: 162 mg/dL — ABNORMAL HIGH (ref 70–99)
Potassium: 3.7 mmol/L (ref 3.5–5.1)
Sodium: 137 mmol/L (ref 135–145)
Total Bilirubin: 0.7 mg/dL (ref 0.3–1.2)
Total Protein: 6.5 g/dL (ref 6.5–8.1)

## 2023-02-16 LAB — CBC
HCT: 41.1 % (ref 39.0–52.0)
Hemoglobin: 13.8 g/dL (ref 13.0–17.0)
MCH: 27.1 pg (ref 26.0–34.0)
MCHC: 33.6 g/dL (ref 30.0–36.0)
MCV: 80.6 fL (ref 80.0–100.0)
Platelets: 288 10*3/uL (ref 150–400)
RBC: 5.1 MIL/uL (ref 4.22–5.81)
RDW: 13.3 % (ref 11.5–15.5)
WBC: 5.2 10*3/uL (ref 4.0–10.5)
nRBC: 0 % (ref 0.0–0.2)

## 2023-02-16 LAB — I-STAT CHEM 8, ED
BUN: 18 mg/dL (ref 6–20)
Calcium, Ion: 1.23 mmol/L (ref 1.15–1.40)
Chloride: 108 mmol/L (ref 98–111)
Creatinine, Ser: 1.3 mg/dL — ABNORMAL HIGH (ref 0.61–1.24)
Glucose, Bld: 159 mg/dL — ABNORMAL HIGH (ref 70–99)
HCT: 41 % (ref 39.0–52.0)
Hemoglobin: 13.9 g/dL (ref 13.0–17.0)
Potassium: 3.8 mmol/L (ref 3.5–5.1)
Sodium: 143 mmol/L (ref 135–145)
TCO2: 23 mmol/L (ref 22–32)

## 2023-02-16 LAB — HEMOGLOBIN AND HEMATOCRIT, BLOOD
HCT: 39.8 % (ref 39.0–52.0)
Hemoglobin: 13.5 g/dL (ref 13.0–17.0)

## 2023-02-16 MED ORDER — POLYETHYLENE GLYCOL 3350 17 G PO PACK: 17.0000 g | PACK | Freq: Every day | ORAL | Status: DC | PRN

## 2023-02-16 MED ORDER — ONDANSETRON 4 MG PO TBDP: 4.0000 mg | ORAL_TABLET | Freq: Four times a day (QID) | ORAL | Status: DC | PRN

## 2023-02-16 MED ORDER — MELATONIN 3 MG PO TABS: 3.0000 mg | ORAL_TABLET | Freq: Every evening | ORAL | Status: DC | PRN

## 2023-02-16 MED ORDER — BACITRACIN ZINC 500 UNIT/GM EX OINT
TOPICAL_OINTMENT | Freq: Two times a day (BID) | CUTANEOUS | Status: DC
  Administered 2023-02-17: 2 via TOPICAL
  Administered 2023-02-18 – 2023-02-19 (×2): 31.5 via TOPICAL
  Filled 2023-02-16: qty 28.4
  Filled 2023-02-16: qty 1.8

## 2023-02-16 MED ORDER — HYDROMORPHONE HCL 1 MG/ML IJ SOLN
0.5000 mg | INTRAMUSCULAR | Status: DC | PRN
  Administered 2023-02-17 (×2): 0.5 mg via INTRAVENOUS
  Filled 2023-02-16 (×2): qty 1

## 2023-02-16 MED ORDER — SODIUM CHLORIDE 0.9 % IV BOLUS
500.0000 mL | Freq: Once | INTRAVENOUS | Status: AC
  Administered 2023-02-16: 500 mL via INTRAVENOUS

## 2023-02-16 MED ORDER — DOCUSATE SODIUM 100 MG PO CAPS
100.0000 mg | ORAL_CAPSULE | Freq: Two times a day (BID) | ORAL | Status: DC
  Administered 2023-02-16 – 2023-02-19 (×3): 100 mg via ORAL
  Filled 2023-02-16 (×4): qty 1

## 2023-02-16 MED ORDER — IOHEXOL 350 MG/ML SOLN
75.0000 mL | Freq: Once | INTRAVENOUS | Status: AC | PRN
  Administered 2023-02-16: 75 mL via INTRAVENOUS

## 2023-02-16 MED ORDER — OXYCODONE HCL 5 MG PO TABS
5.0000 mg | ORAL_TABLET | ORAL | Status: DC | PRN
  Administered 2023-02-17 (×2): 5 mg via ORAL
  Filled 2023-02-16 (×3): qty 1

## 2023-02-16 MED ORDER — HYDRALAZINE HCL 20 MG/ML IJ SOLN: 10.0000 mg | INTRAMUSCULAR | Status: DC | PRN

## 2023-02-16 MED ORDER — BACITRACIN ZINC 500 UNIT/GM EX OINT
TOPICAL_OINTMENT | Freq: Once | CUTANEOUS | Status: AC
  Administered 2023-02-16: 1 via TOPICAL
  Filled 2023-02-16: qty 1.8

## 2023-02-16 MED ORDER — ONDANSETRON HCL 4 MG/2ML IJ SOLN: 4.0000 mg | Freq: Four times a day (QID) | INTRAMUSCULAR | Status: DC | PRN

## 2023-02-16 MED ORDER — ACETAMINOPHEN 500 MG PO TABS
1000.0000 mg | ORAL_TABLET | Freq: Four times a day (QID) | ORAL | Status: DC
  Administered 2023-02-16 – 2023-02-19 (×6): 1000 mg via ORAL
  Filled 2023-02-16 (×6): qty 2

## 2023-02-16 MED ORDER — TETANUS-DIPHTH-ACELL PERTUSSIS 5-2.5-18.5 LF-MCG/0.5 IM SUSY: 0.5000 mL | PREFILLED_SYRINGE | Freq: Once | INTRAMUSCULAR | Status: DC

## 2023-02-16 MED ORDER — ENOXAPARIN SODIUM 30 MG/0.3ML IJ SOSY
30.0000 mg | PREFILLED_SYRINGE | Freq: Two times a day (BID) | INTRAMUSCULAR | Status: DC
  Administered 2023-02-18 – 2023-02-19 (×3): 30 mg via SUBCUTANEOUS
  Filled 2023-02-16 (×3): qty 0.3

## 2023-02-16 MED ORDER — METHOCARBAMOL 500 MG PO TABS
500.0000 mg | ORAL_TABLET | Freq: Three times a day (TID) | ORAL | Status: DC
  Administered 2023-02-16 – 2023-02-17 (×3): 500 mg via ORAL
  Filled 2023-02-16 (×3): qty 1

## 2023-02-16 MED ORDER — METOPROLOL TARTRATE 5 MG/5ML IV SOLN: 5.0000 mg | Freq: Four times a day (QID) | INTRAVENOUS | Status: DC | PRN

## 2023-02-16 MED ORDER — METHOCARBAMOL 1000 MG/10ML IJ SOLN
500.0000 mg | Freq: Three times a day (TID) | INTRAVENOUS | Status: DC
  Filled 2023-02-16: qty 5
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
## 2023-02-16 NOTE — ED Notes (Addendum)
Pt c-collar removed per secure chat message from MD Lynelle Doctor.

## 2023-02-17 LAB — CBC
HCT: 37.7 % — ABNORMAL LOW (ref 39.0–52.0)
Hemoglobin: 12.7 g/dL — ABNORMAL LOW (ref 13.0–17.0)
MCH: 26.6 pg (ref 26.0–34.0)
MCHC: 33.7 g/dL (ref 30.0–36.0)
MCV: 79 fL — ABNORMAL LOW (ref 80.0–100.0)
Platelets: 257 10*3/uL (ref 150–400)
RBC: 4.77 MIL/uL (ref 4.22–5.81)
RDW: 13.4 % (ref 11.5–15.5)
WBC: 8.5 10*3/uL (ref 4.0–10.5)
nRBC: 0 % (ref 0.0–0.2)

## 2023-02-17 LAB — BASIC METABOLIC PANEL
Anion gap: 6 (ref 5–15)
BUN: 12 mg/dL (ref 6–20)
CO2: 24 mmol/L (ref 22–32)
Calcium: 8.5 mg/dL — ABNORMAL LOW (ref 8.9–10.3)
Chloride: 106 mmol/L (ref 98–111)
Creatinine, Ser: 1 mg/dL (ref 0.61–1.24)
GFR, Estimated: >60 mL/min (ref >60)
Glucose, Bld: 101 mg/dL — ABNORMAL HIGH (ref 70–99)
Potassium: 4 mmol/L (ref 3.5–5.1)
Sodium: 136 mmol/L (ref 135–145)

## 2023-02-17 LAB — HIV ANTIBODY (ROUTINE TESTING W REFLEX): HIV Screen 4th Generation wRfx: NONREACTIVE

## 2023-02-17 LAB — HEMOGLOBIN AND HEMATOCRIT, BLOOD
HCT: 37.8 % — ABNORMAL LOW (ref 39.0–52.0)
HCT: 38.5 % — ABNORMAL LOW (ref 39.0–52.0)
Hemoglobin: 12.7 g/dL — ABNORMAL LOW (ref 13.0–17.0)
Hemoglobin: 13.3 g/dL (ref 13.0–17.0)
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
## 2023-02-17 NOTE — Progress Notes (Signed)
Patient arrived to unit via ED. Aox4, vitals WNL and focused assessment completed. Portable tele on.   Bed in lowest position, call light in reach and grip socks on.   Personal belongings:Cell phone and charger at bedside with patient

## 2023-02-18 LAB — CBC
HCT: 38.3 % — ABNORMAL LOW (ref 39.0–52.0)
Hemoglobin: 13.2 g/dL (ref 13.0–17.0)
MCH: 27.6 pg (ref 26.0–34.0)
MCHC: 34.5 g/dL (ref 30.0–36.0)
MCV: 80 fL (ref 80.0–100.0)
Platelets: 232 10*3/uL (ref 150–400)
RBC: 4.79 MIL/uL (ref 4.22–5.81)
RDW: 13 % (ref 11.5–15.5)
WBC: 7.1 10*3/uL (ref 4.0–10.5)
nRBC: 0 % (ref 0.0–0.2)

## 2023-02-18 MED ORDER — OXYCODONE HCL 5 MG PO TABS: 5.0000 mg | ORAL_TABLET | Freq: Four times a day (QID) | ORAL | Status: DC | PRN

## 2023-02-18 MED ORDER — METHOCARBAMOL 500 MG PO TABS
500.0000 mg | ORAL_TABLET | Freq: Four times a day (QID) | ORAL | Status: DC | PRN
  Administered 2023-02-18 – 2023-02-19 (×2): 500 mg via ORAL
  Filled 2023-02-18 (×2): qty 1
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
## 2023-02-19 LAB — CBC
HCT: 38.4 % — ABNORMAL LOW (ref 39.0–52.0)
Hemoglobin: 13 g/dL (ref 13.0–17.0)
MCH: 26.5 pg (ref 26.0–34.0)
MCHC: 33.9 g/dL (ref 30.0–36.0)
MCV: 78.4 fL — ABNORMAL LOW (ref 80.0–100.0)
Platelets: 233 10*3/uL (ref 150–400)
RBC: 4.9 MIL/uL (ref 4.22–5.81)
RDW: 12.8 % (ref 11.5–15.5)
WBC: 5.7 10*3/uL (ref 4.0–10.5)
nRBC: 0 % (ref 0.0–0.2)

## 2023-02-19 MED ORDER — BACITRACIN ZINC 500 UNIT/GM EX OINT: TOPICAL_OINTMENT | Freq: Two times a day (BID) | CUTANEOUS | Status: AC

## 2023-02-19 MED ORDER — METHOCARBAMOL 500 MG PO TABS: 500.0000 mg | ORAL_TABLET | Freq: Four times a day (QID) | ORAL | 0 refills | Status: AC | PRN

## 2023-02-19 MED ORDER — ACETAMINOPHEN 500 MG PO TABS: 1000.0000 mg | ORAL_TABLET | Freq: Four times a day (QID) | ORAL | Status: AC | PRN

## 2023-02-19 MED ORDER — DOCUSATE SODIUM 100 MG PO CAPS: 100.0000 mg | ORAL_CAPSULE | Freq: Every day | ORAL | Status: AC | PRN
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
## 2023-02-19 NOTE — Progress Notes (Signed)
Pt discharge education and instructions completed with pt who voices understanding, denies any questions. Pt IV and telemetry removed. Pt discharged home with mother who will come pick up pt to transport him home. Pt to pick up electronically sent Robaxin prescription from preferred pharmacy on file. All pt wounds cleaned and dsg changed. Pt waiting on mother for his personal clothing and to transport him home. Arabella Merles Natalyah Cummiskey RN.
